# Patient Record
Sex: Female | Born: 1970
Health system: Southern US, Community
[De-identification: ages and names within clinical notes are randomized; demographics above are authoritative.]

## PROBLEM LIST (undated history)

## (undated) DIAGNOSIS — E079 Disorder of thyroid, unspecified: Secondary | ICD-10-CM

## (undated) DIAGNOSIS — I10 Essential (primary) hypertension: Secondary | ICD-10-CM

## (undated) HISTORY — PX: DILATION AND CURETTAGE OF UTERUS: SHX78

## (undated) HISTORY — DX: Disorder of thyroid, unspecified: E07.9

## (undated) HISTORY — DX: Essential (primary) hypertension: I10

## (undated) HISTORY — PX: KNEE SURGERY: SHX244

---

## 2001-06-16 ENCOUNTER — Other Ambulatory Visit: Admission: RE | Admit: 2001-06-16 | Discharge: 2001-06-16 | Payer: Self-pay | Admitting: Obstetrics and Gynecology

## 2002-02-18 ENCOUNTER — Inpatient Hospital Stay (HOSPITAL_COMMUNITY): Admission: AD | Admit: 2002-02-18 | Discharge: 2002-02-21 | Payer: Self-pay | Admitting: Obstetrics and Gynecology

## 2002-03-31 ENCOUNTER — Other Ambulatory Visit: Admission: RE | Admit: 2002-03-31 | Discharge: 2002-03-31 | Payer: Self-pay | Admitting: Obstetrics and Gynecology

## 2005-06-03 ENCOUNTER — Other Ambulatory Visit: Admission: RE | Admit: 2005-06-03 | Discharge: 2005-06-03 | Payer: Self-pay | Admitting: Obstetrics and Gynecology

## 2005-07-06 ENCOUNTER — Inpatient Hospital Stay (HOSPITAL_COMMUNITY): Admission: AD | Admit: 2005-07-06 | Discharge: 2005-07-06 | Payer: Self-pay | Admitting: Obstetrics and Gynecology

## 2007-04-04 ENCOUNTER — Inpatient Hospital Stay (HOSPITAL_COMMUNITY): Admission: AD | Admit: 2007-04-04 | Discharge: 2007-04-04 | Payer: Self-pay | Admitting: Obstetrics and Gynecology

## 2007-04-07 ENCOUNTER — Ambulatory Visit (HOSPITAL_COMMUNITY): Admission: RE | Admit: 2007-04-07 | Discharge: 2007-04-07 | Payer: Self-pay | Admitting: Obstetrics and Gynecology

## 2007-04-07 ENCOUNTER — Encounter (INDEPENDENT_AMBULATORY_CARE_PROVIDER_SITE_OTHER): Payer: Self-pay | Admitting: Obstetrics and Gynecology

## 2008-09-13 IMAGING — US US OB COMP LESS 14 WK
1 series · 14 of 25 positions shown · non-contrast
Comparison: none

OBSTETRICAL ULTRASOUND:

 This ultrasound exam was performed in the [HOSPITAL] Ultrasound Department.  The OB US report was generated in the AS system, and faxed to the ordering physician.  This report is also available in [REDACTED] PACS.

[Series 1: us ob comp less 14 wk · 0.28mm/px · 25 acquisitions, 14 frames shown]
[im 1/25]
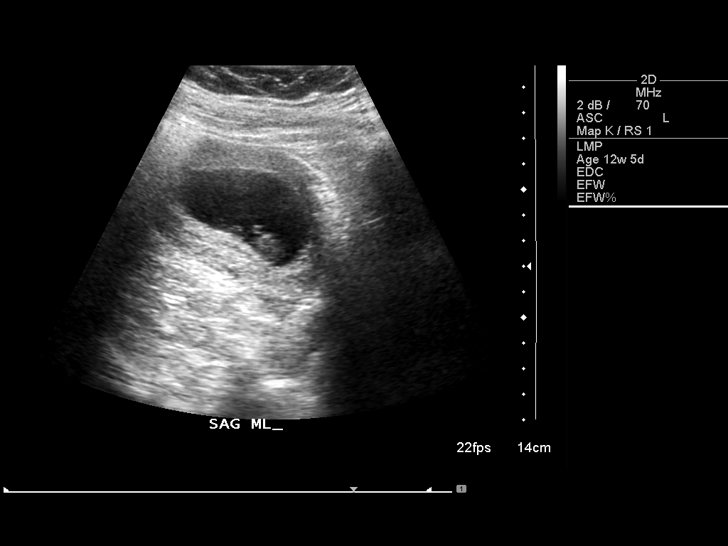
[im 3/25]
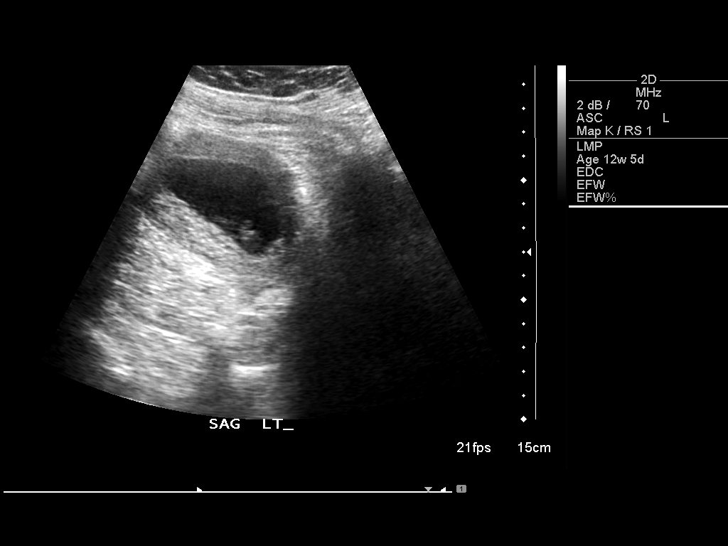
[im 5/25]
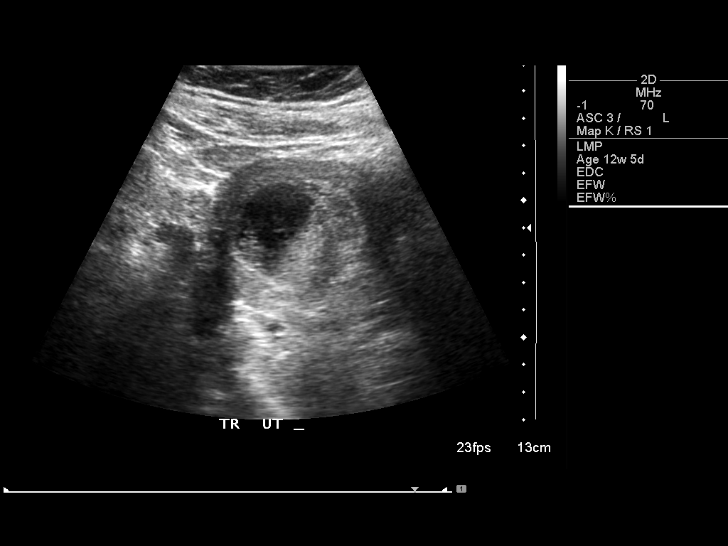
[im 7/25]
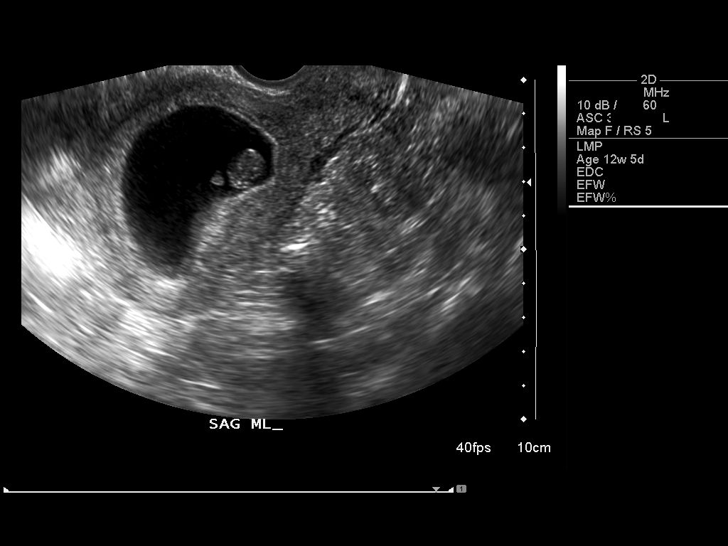
[im 9/25]
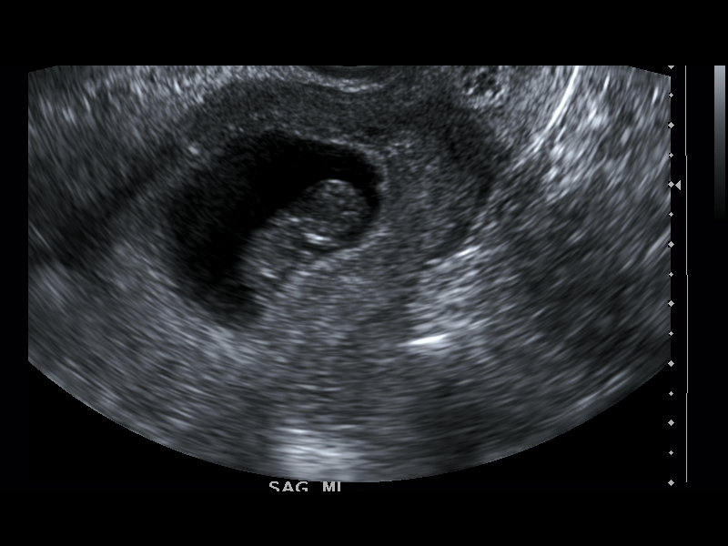
[im 10/25]
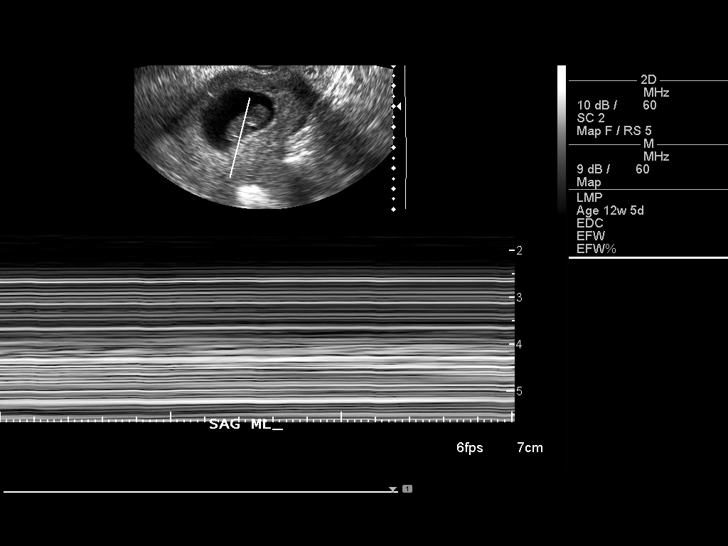
[im 12/25]
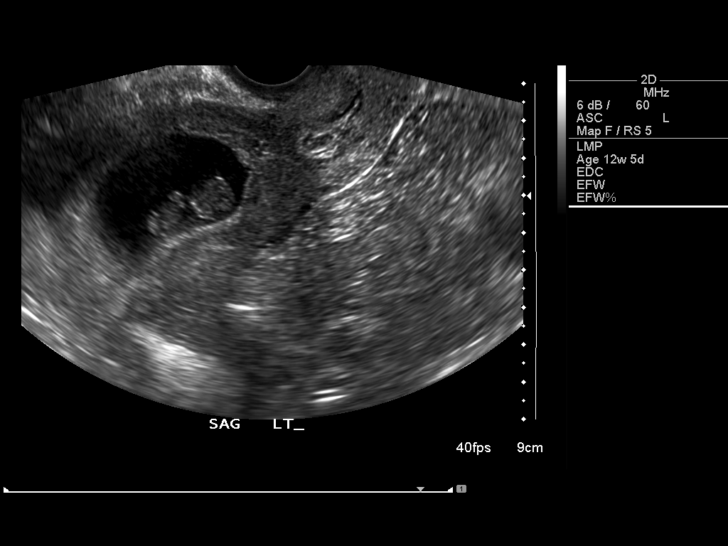
[im 14/25]
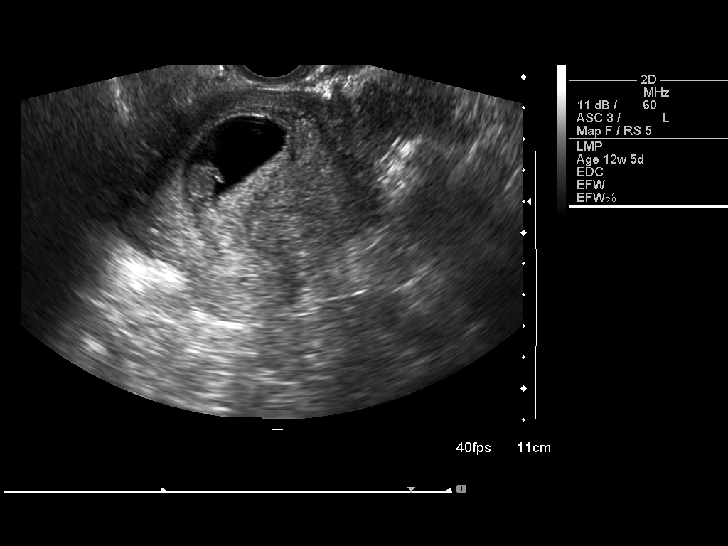
[im 16/25]
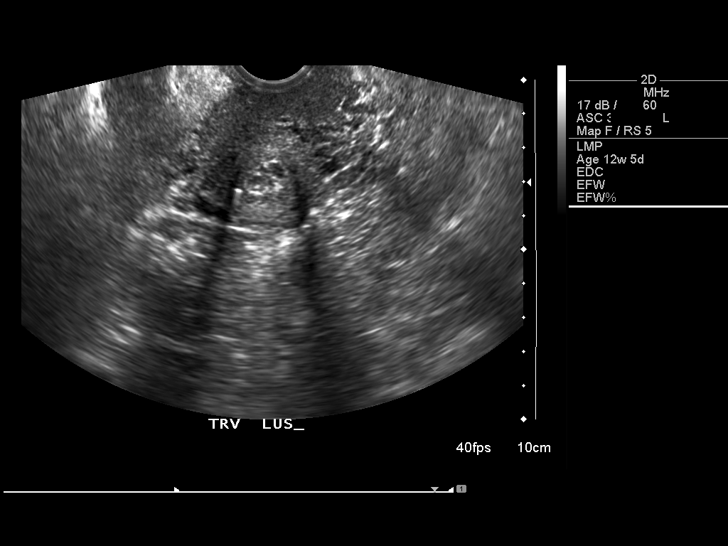
[im 17/25]
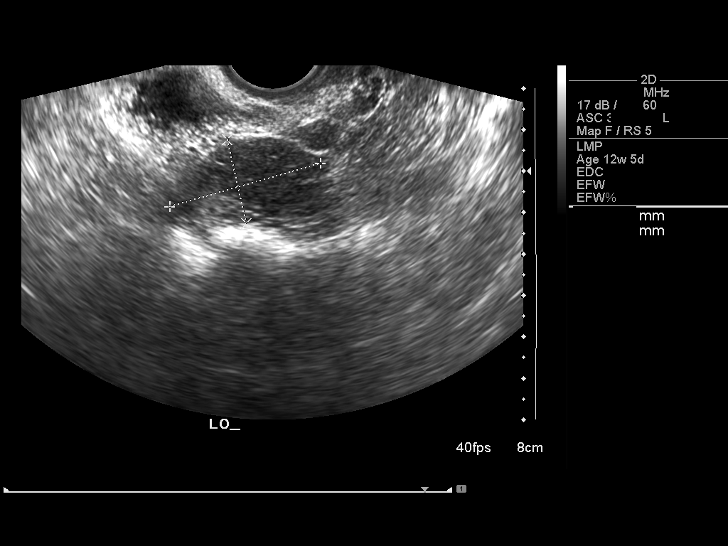
[im 19/25]
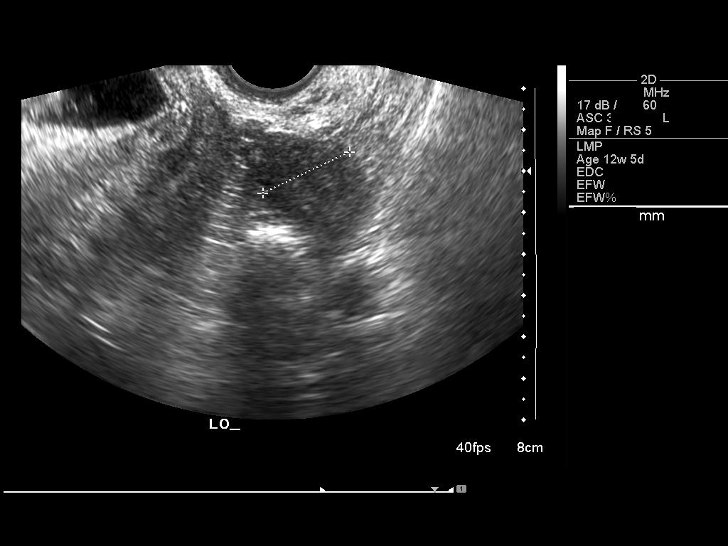
[im 21/25]
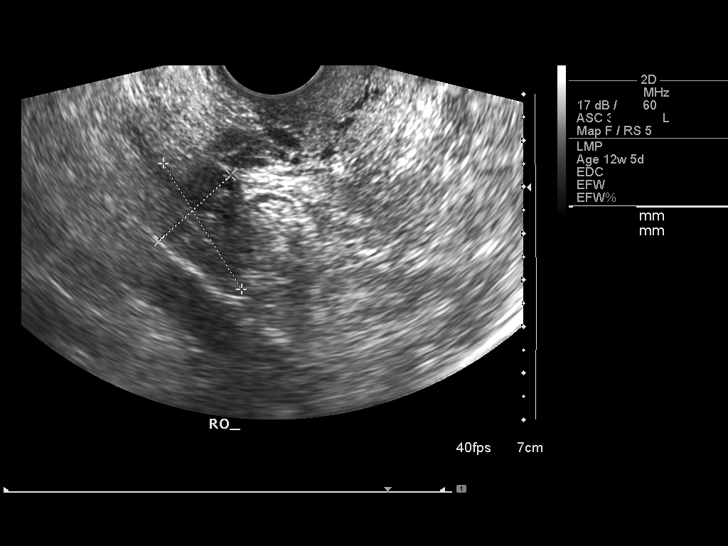
[im 23/25]
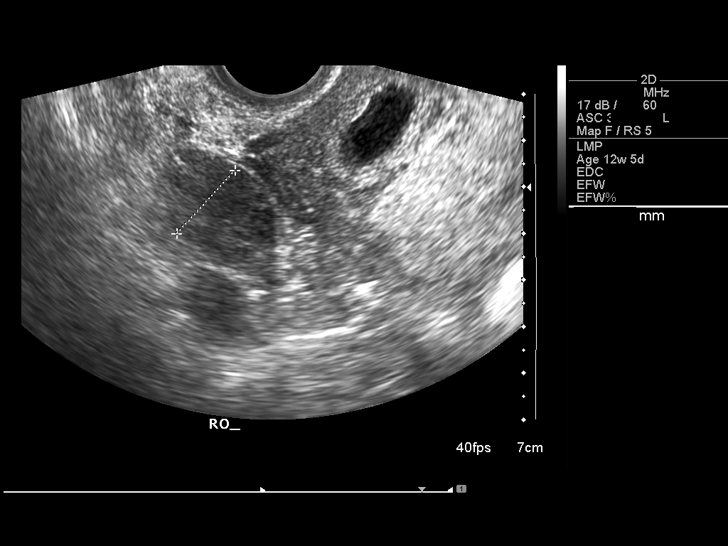
[im 25/25]
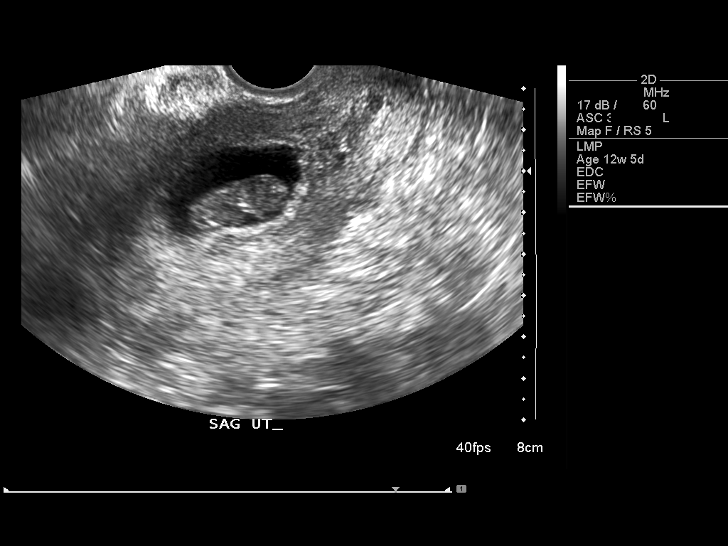

[14 of 25 positions shown; findings below may reference images not displayed]

IMPRESSION: See AS Obstetric US report.

## 2010-07-22 ENCOUNTER — Encounter: Admission: RE | Admit: 2010-07-22 | Discharge: 2010-07-22 | Payer: Self-pay | Admitting: Allergy

## 2010-11-24 ENCOUNTER — Encounter: Payer: Self-pay | Admitting: Internal Medicine

## 2011-03-18 NOTE — Op Note (Signed)
Amber Levy, Amber Levy                 ACCOUNT NO.:  [id number]   MEDICAL RECORD NO.:  [id number]          PATIENT TYPE:  AMB   LOCATION:  SDC                           FACILITY:  [facility]   PHYSICIAN:  Michelle L. Grewal, M.D.DATE OF BIRTH:  1970-11-15   DATE OF PROCEDURE:  04/07/2007  DATE OF DISCHARGE:                               OPERATIVE REPORT   PREOPERATIVE DIAGNOSIS:  Missed abortion. Blood type O positive.   POSTOPERATIVE DIAGNOSIS:  Missed abortion. Blood type O positive.   PROCEDURE:  Dilatation and evacuation.   SURGEON:  Dr. Vincente Poli.   ANESTHESIA:  MAC with local.   ESTIMATED BLOOD LOSS:  Minimal.   COMPLICATIONS:  None.   The patient was given informed consent. She was counseled and  acknowledged the known accepted risks of surgery which include a risk of  infection, risk of bleeding, risk of retained products and complications  associated with that, and risk of uterine perforation. She was taken to  the operating room after informed consent was obtained. She was prepped  and draped and given anesthesia. She was then placed in the lithotomy  position. She was then prepped and draped. After that, a speculum was  placed in the vagina. The cervix was noted to be slightly open, and this  is consistent with her report prior to the surgery that she had passed a  large amount of tissue early this morning. The cervix was grasped with a  tenaculum, and a paracervical block was performed. We did not need to  dilate her cervix because it was already dilated somewhat. I inserted a  #7 suction cannula and thoroughly suctioned the uterus out of probably a  minimal amount of tissue associated with products of conception. The  suction cannula was removed, a sharp curet was inserted, and a thorough  sharp curettage was performed x2. A final suction curettage was  performed. I was fairly confident that the uterine cavity was cleaned of  all tissue. At the end of the tissue, all  instruments were removed from  the vagina. All sponge, lap and instrument counts were correct x2. The  patient was offered chromosome analysis, but she and her husband  declined.   ESTIMATED BLOOD LOSS:  Minimal.   COMPLICATIONS:  None.      Michelle L. Vincente Poli, M.D.  Electronically Signed    MLG/MEDQ  D:  04/07/2007  T:  04/07/2007  Job:  [id number]

## 2011-08-21 LAB — CBC
MCHC: 33.6
MCV: 83.8
Platelets: 289
WBC: 9.4

## 2012-01-01 IMAGING — CT CT PARANASAL SINUSES LIMITED
1 series · 11 of 13 positions shown, 14 images · non-contrast
Comparison: None.

CLINICAL DATA: Frequent sinus infections, and drainage, cough

CT PARANASAL SINUS LIMITED WITHOUT CONTRAST
TECHNIQUE: Multidetector CT images of the paranasal sinuses were
obtained in a single plane without contrast.

[Series 2: cor bone · axial · 0.35mm/px · z∈[+8,+98]mm · 11 of 13 slices shown, 14 images]
[im 2/13  brain]
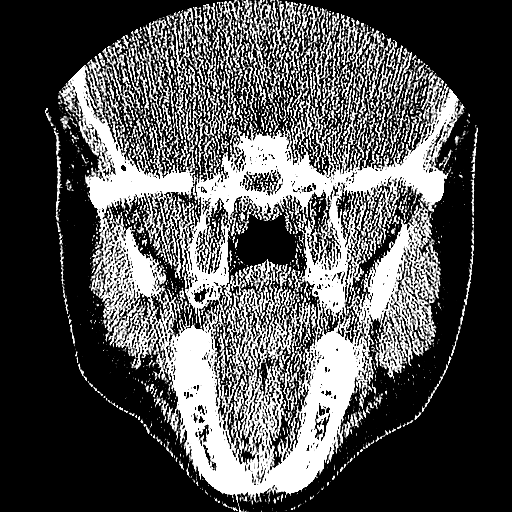
[im 2/13  bone]
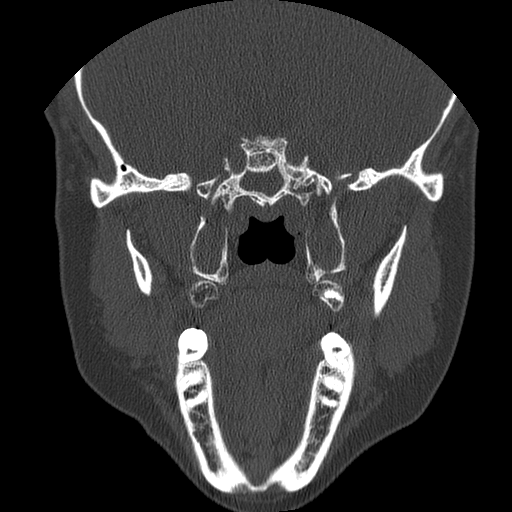
[im 3/13  bone]
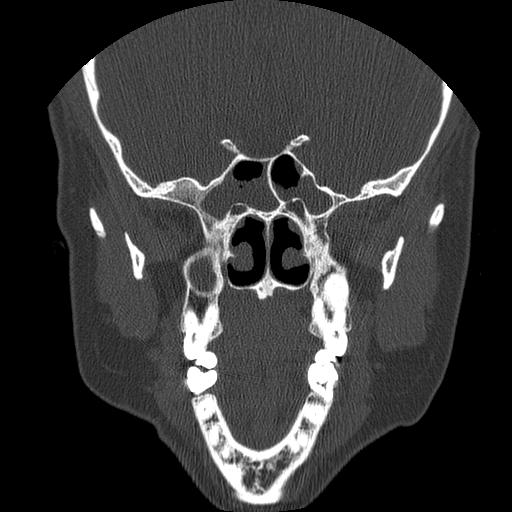
[im 4/13  bone]
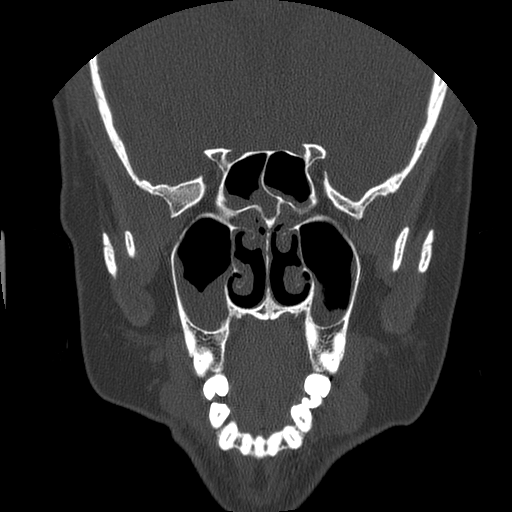
[im 5/13  bone]
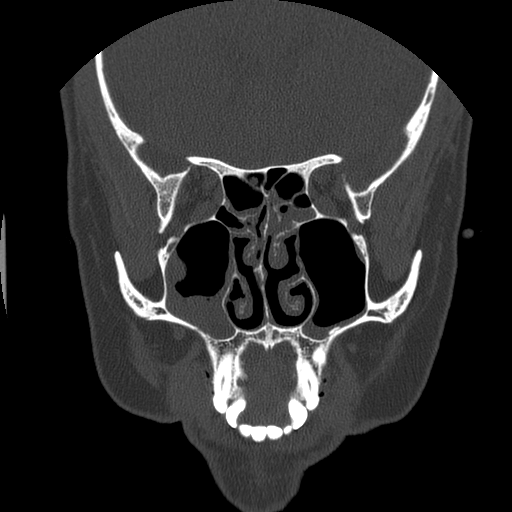
[im 6/13  brain]
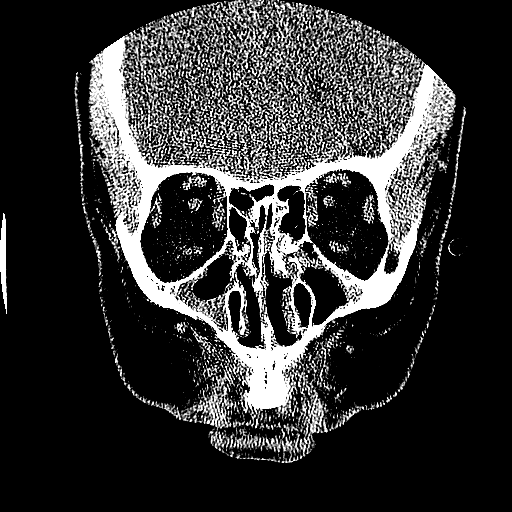
[im 6/13  bone]
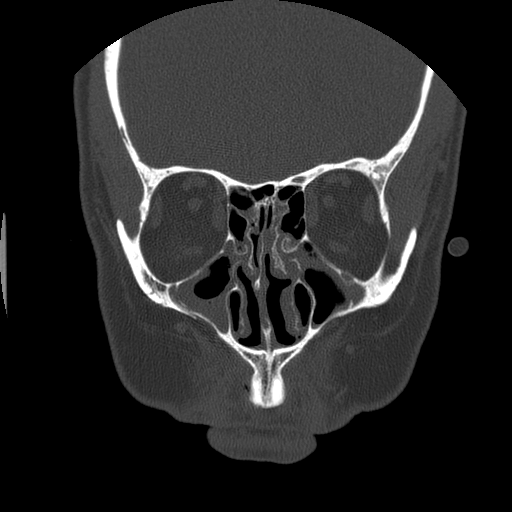
[im 7/13  bone]
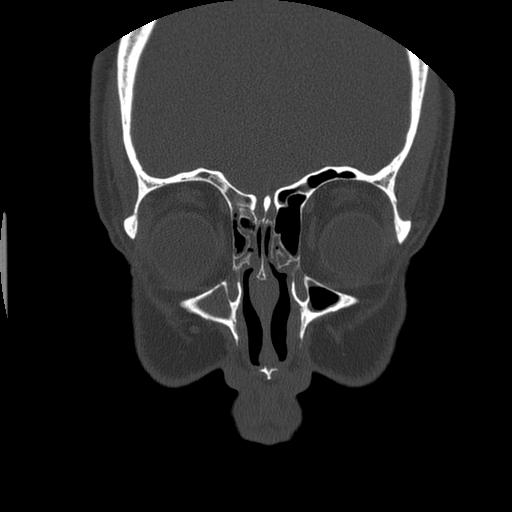
[im 8/13  bone]
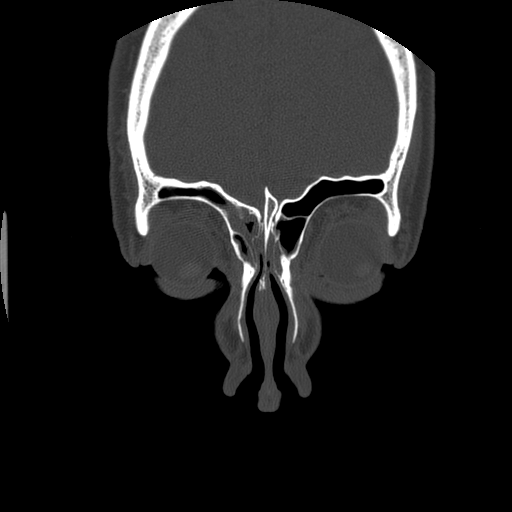
[im 9/13  bone]
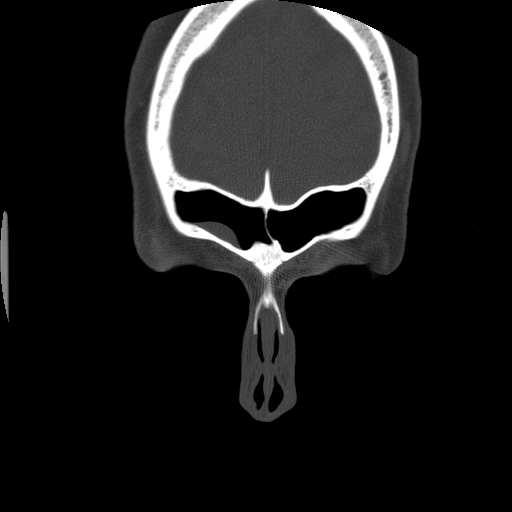
[im 10/13  brain]
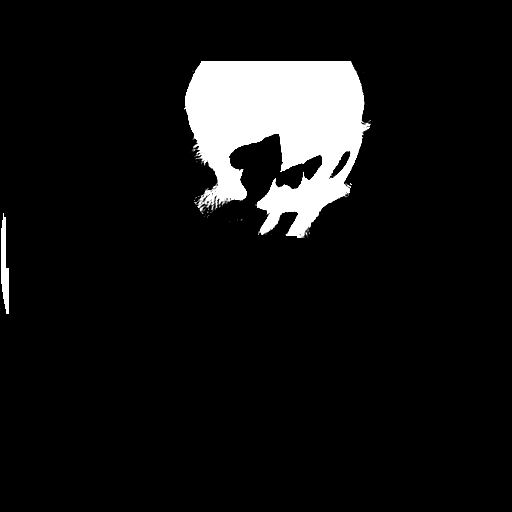
[im 10/13  bone]
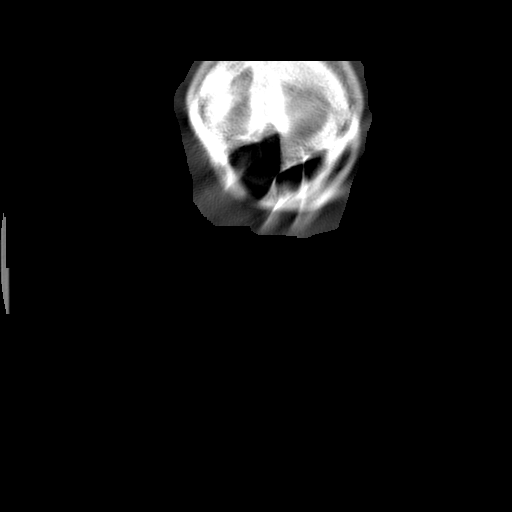
[im 11/13  bone]
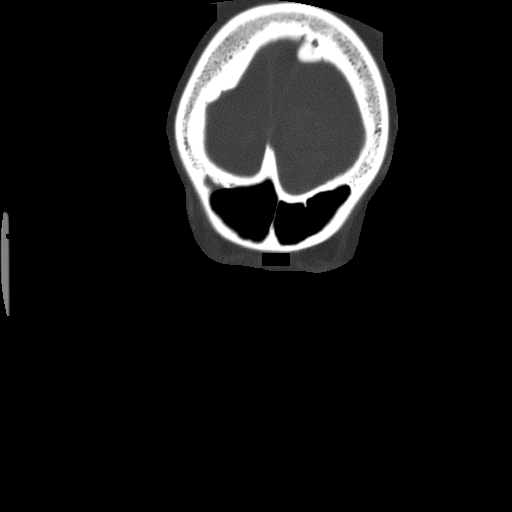
[im 12/13  bone]
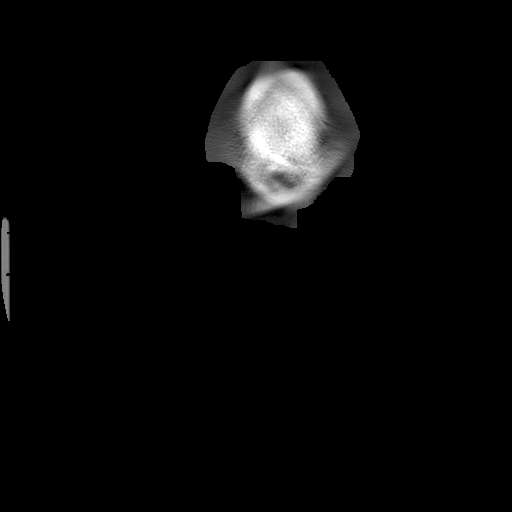

[11 of 13 positions shown; findings below may reference images not displayed]

FINDINGS: There is mucosal thickening in the maxillary sinuses,
ethmoid air cells, sphenoid sinus and frontal sinus consistent with
paranasal sinus disease diffusely.  There may be a small amount of
fluid within the left maxillary sinus.  The nasal turbinates are
normal in size and position and the nasal airway is patent.  No
bony abnormality is seen.
IMPRESSION: Diffuse paranasal sinus disease.

## 2012-03-08 ENCOUNTER — Other Ambulatory Visit: Payer: Self-pay | Admitting: Obstetrics and Gynecology

## 2013-09-27 ENCOUNTER — Other Ambulatory Visit: Payer: Self-pay | Admitting: Obstetrics and Gynecology

## 2015-04-04 ENCOUNTER — Other Ambulatory Visit: Payer: Self-pay | Admitting: Obstetrics and Gynecology

## 2015-04-06 LAB — CYTOLOGY - PAP

## 2016-04-28 ENCOUNTER — Ambulatory Visit: Payer: 59 | Admitting: Family Medicine

## 2016-11-23 DIAGNOSIS — Z23 Encounter for immunization: Secondary | ICD-10-CM | POA: Diagnosis not present

## 2017-02-23 DIAGNOSIS — J0100 Acute maxillary sinusitis, unspecified: Secondary | ICD-10-CM | POA: Diagnosis not present

## 2017-03-31 DIAGNOSIS — J3081 Allergic rhinitis due to animal (cat) (dog) hair and dander: Secondary | ICD-10-CM | POA: Diagnosis not present

## 2017-03-31 DIAGNOSIS — J4530 Mild persistent asthma, uncomplicated: Secondary | ICD-10-CM | POA: Diagnosis not present

## 2017-03-31 DIAGNOSIS — J3089 Other allergic rhinitis: Secondary | ICD-10-CM | POA: Diagnosis not present

## 2017-06-04 DIAGNOSIS — Z0000 Encounter for general adult medical examination without abnormal findings: Secondary | ICD-10-CM | POA: Diagnosis not present

## 2017-06-11 DIAGNOSIS — E039 Hypothyroidism, unspecified: Secondary | ICD-10-CM | POA: Diagnosis not present

## 2017-06-11 DIAGNOSIS — M25561 Pain in right knee: Secondary | ICD-10-CM | POA: Diagnosis not present

## 2017-06-11 DIAGNOSIS — Z0000 Encounter for general adult medical examination without abnormal findings: Secondary | ICD-10-CM | POA: Diagnosis not present

## 2017-06-24 DIAGNOSIS — E039 Hypothyroidism, unspecified: Secondary | ICD-10-CM | POA: Diagnosis not present

## 2017-06-24 DIAGNOSIS — M25561 Pain in right knee: Secondary | ICD-10-CM | POA: Diagnosis not present

## 2017-06-24 DIAGNOSIS — I10 Essential (primary) hypertension: Secondary | ICD-10-CM | POA: Diagnosis not present

## 2017-07-22 DIAGNOSIS — Z23 Encounter for immunization: Secondary | ICD-10-CM | POA: Diagnosis not present

## 2017-09-22 DIAGNOSIS — M25561 Pain in right knee: Secondary | ICD-10-CM | POA: Diagnosis not present

## 2017-09-22 DIAGNOSIS — S83242A Other tear of medial meniscus, current injury, left knee, initial encounter: Secondary | ICD-10-CM | POA: Diagnosis not present

## 2017-09-22 DIAGNOSIS — M7502 Adhesive capsulitis of left shoulder: Secondary | ICD-10-CM | POA: Diagnosis not present

## 2017-10-14 DIAGNOSIS — I10 Essential (primary) hypertension: Secondary | ICD-10-CM | POA: Diagnosis not present

## 2017-10-14 DIAGNOSIS — K210 Gastro-esophageal reflux disease with esophagitis: Secondary | ICD-10-CM | POA: Diagnosis not present

## 2017-10-14 DIAGNOSIS — E039 Hypothyroidism, unspecified: Secondary | ICD-10-CM | POA: Diagnosis not present

## 2017-10-15 DIAGNOSIS — M25561 Pain in right knee: Secondary | ICD-10-CM | POA: Diagnosis not present

## 2017-10-19 DIAGNOSIS — S83281A Other tear of lateral meniscus, current injury, right knee, initial encounter: Secondary | ICD-10-CM | POA: Diagnosis not present

## 2017-11-05 DIAGNOSIS — J040 Acute laryngitis: Secondary | ICD-10-CM | POA: Diagnosis not present

## 2017-11-05 DIAGNOSIS — J069 Acute upper respiratory infection, unspecified: Secondary | ICD-10-CM | POA: Diagnosis not present

## 2017-11-10 DIAGNOSIS — R05 Cough: Secondary | ICD-10-CM | POA: Diagnosis not present

## 2017-11-10 DIAGNOSIS — J018 Other acute sinusitis: Secondary | ICD-10-CM | POA: Diagnosis not present

## 2017-11-25 DIAGNOSIS — M6728 Synovial hypertrophy, not elsewhere classified, other site: Secondary | ICD-10-CM | POA: Diagnosis not present

## 2017-11-25 DIAGNOSIS — M23221 Derangement of posterior horn of medial meniscus due to old tear or injury, right knee: Secondary | ICD-10-CM | POA: Diagnosis not present

## 2017-11-25 DIAGNOSIS — M23241 Derangement of anterior horn of lateral meniscus due to old tear or injury, right knee: Secondary | ICD-10-CM | POA: Diagnosis not present

## 2017-11-25 DIAGNOSIS — G8918 Other acute postprocedural pain: Secondary | ICD-10-CM | POA: Diagnosis not present

## 2017-12-07 DIAGNOSIS — M25561 Pain in right knee: Secondary | ICD-10-CM | POA: Diagnosis not present

## 2017-12-07 DIAGNOSIS — Z4789 Encounter for other orthopedic aftercare: Secondary | ICD-10-CM | POA: Diagnosis not present

## 2017-12-08 DIAGNOSIS — Z6832 Body mass index (BMI) 32.0-32.9, adult: Secondary | ICD-10-CM | POA: Diagnosis not present

## 2017-12-08 DIAGNOSIS — Z1212 Encounter for screening for malignant neoplasm of rectum: Secondary | ICD-10-CM | POA: Diagnosis not present

## 2017-12-08 DIAGNOSIS — Z01419 Encounter for gynecological examination (general) (routine) without abnormal findings: Secondary | ICD-10-CM | POA: Diagnosis not present

## 2017-12-21 DIAGNOSIS — M25561 Pain in right knee: Secondary | ICD-10-CM | POA: Diagnosis not present

## 2017-12-21 DIAGNOSIS — Z4789 Encounter for other orthopedic aftercare: Secondary | ICD-10-CM | POA: Diagnosis not present

## 2018-03-30 DIAGNOSIS — J4530 Mild persistent asthma, uncomplicated: Secondary | ICD-10-CM | POA: Diagnosis not present

## 2018-03-30 DIAGNOSIS — J3081 Allergic rhinitis due to animal (cat) (dog) hair and dander: Secondary | ICD-10-CM | POA: Diagnosis not present

## 2018-03-30 DIAGNOSIS — J3089 Other allergic rhinitis: Secondary | ICD-10-CM | POA: Diagnosis not present

## 2018-05-18 DIAGNOSIS — N390 Urinary tract infection, site not specified: Secondary | ICD-10-CM | POA: Diagnosis not present

## 2018-05-18 DIAGNOSIS — R300 Dysuria: Secondary | ICD-10-CM | POA: Diagnosis not present

## 2018-06-29 DIAGNOSIS — N390 Urinary tract infection, site not specified: Secondary | ICD-10-CM | POA: Diagnosis not present

## 2018-07-29 DIAGNOSIS — Z23 Encounter for immunization: Secondary | ICD-10-CM | POA: Diagnosis not present

## 2018-08-16 DIAGNOSIS — I10 Essential (primary) hypertension: Secondary | ICD-10-CM | POA: Diagnosis not present

## 2018-08-16 DIAGNOSIS — K760 Fatty (change of) liver, not elsewhere classified: Secondary | ICD-10-CM | POA: Diagnosis not present

## 2018-08-16 DIAGNOSIS — E039 Hypothyroidism, unspecified: Secondary | ICD-10-CM | POA: Diagnosis not present

## 2018-08-16 DIAGNOSIS — Z0000 Encounter for general adult medical examination without abnormal findings: Secondary | ICD-10-CM | POA: Diagnosis not present

## 2018-08-23 DIAGNOSIS — M150 Primary generalized (osteo)arthritis: Secondary | ICD-10-CM | POA: Diagnosis not present

## 2018-08-23 DIAGNOSIS — Z0000 Encounter for general adult medical examination without abnormal findings: Secondary | ICD-10-CM | POA: Diagnosis not present

## 2018-08-23 DIAGNOSIS — K760 Fatty (change of) liver, not elsewhere classified: Secondary | ICD-10-CM | POA: Diagnosis not present

## 2018-08-23 DIAGNOSIS — I10 Essential (primary) hypertension: Secondary | ICD-10-CM | POA: Diagnosis not present

## 2018-08-23 DIAGNOSIS — R1011 Right upper quadrant pain: Secondary | ICD-10-CM | POA: Diagnosis not present

## 2018-08-25 ENCOUNTER — Other Ambulatory Visit: Payer: Self-pay | Admitting: Internal Medicine

## 2018-08-25 DIAGNOSIS — R1011 Right upper quadrant pain: Secondary | ICD-10-CM

## 2018-08-25 DIAGNOSIS — K760 Fatty (change of) liver, not elsewhere classified: Secondary | ICD-10-CM

## 2018-08-31 ENCOUNTER — Ambulatory Visit
Admission: RE | Admit: 2018-08-31 | Discharge: 2018-08-31 | Disposition: A | Discharge status: Home / Self Care | Payer: 59 | Source: Ambulatory Visit | Attending: Internal Medicine | Admitting: Internal Medicine

## 2018-08-31 DIAGNOSIS — K760 Fatty (change of) liver, not elsewhere classified: Secondary | ICD-10-CM | POA: Diagnosis not present

## 2018-08-31 DIAGNOSIS — R1011 Right upper quadrant pain: Secondary | ICD-10-CM

## 2018-09-09 DIAGNOSIS — L4050 Arthropathic psoriasis, unspecified: Secondary | ICD-10-CM | POA: Diagnosis not present

## 2018-09-09 DIAGNOSIS — M25512 Pain in left shoulder: Secondary | ICD-10-CM | POA: Diagnosis not present

## 2018-09-09 DIAGNOSIS — M1990 Unspecified osteoarthritis, unspecified site: Secondary | ICD-10-CM | POA: Diagnosis not present

## 2018-09-09 DIAGNOSIS — M25562 Pain in left knee: Secondary | ICD-10-CM | POA: Diagnosis not present

## 2018-09-09 DIAGNOSIS — L409 Psoriasis, unspecified: Secondary | ICD-10-CM | POA: Diagnosis not present

## 2018-09-09 DIAGNOSIS — M1711 Unilateral primary osteoarthritis, right knee: Secondary | ICD-10-CM | POA: Diagnosis not present

## 2018-09-09 DIAGNOSIS — M79641 Pain in right hand: Secondary | ICD-10-CM | POA: Diagnosis not present

## 2018-09-09 DIAGNOSIS — M19011 Primary osteoarthritis, right shoulder: Secondary | ICD-10-CM | POA: Diagnosis not present

## 2018-09-09 DIAGNOSIS — M79642 Pain in left hand: Secondary | ICD-10-CM | POA: Diagnosis not present

## 2018-11-15 DIAGNOSIS — M25561 Pain in right knee: Secondary | ICD-10-CM | POA: Diagnosis not present

## 2018-11-15 DIAGNOSIS — M1711 Unilateral primary osteoarthritis, right knee: Secondary | ICD-10-CM | POA: Diagnosis not present

## 2019-09-11 IMAGING — US US ABDOMEN LIMITED
1 series · 14 of 25 positions shown · non-contrast
Comparison: Report of an ultrasound March 29, 2011.

CLINICAL DATA: Right upper quadrant pain. Known fatty liver
disease.

EXAM:
ULTRASOUND ABDOMEN LIMITED RIGHT UPPER QUADRANT

[Series 1: us abdomen limited · 0.25mm/px · 14 of 38 slices shown]
[im 1/38]
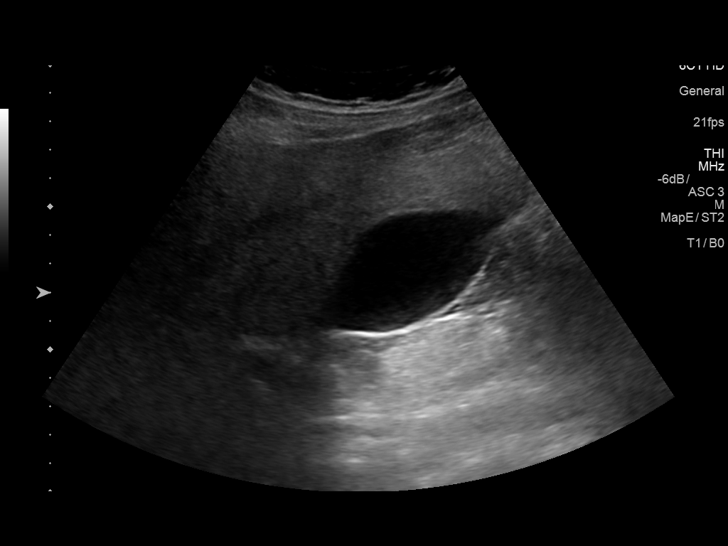
[im 4/38]
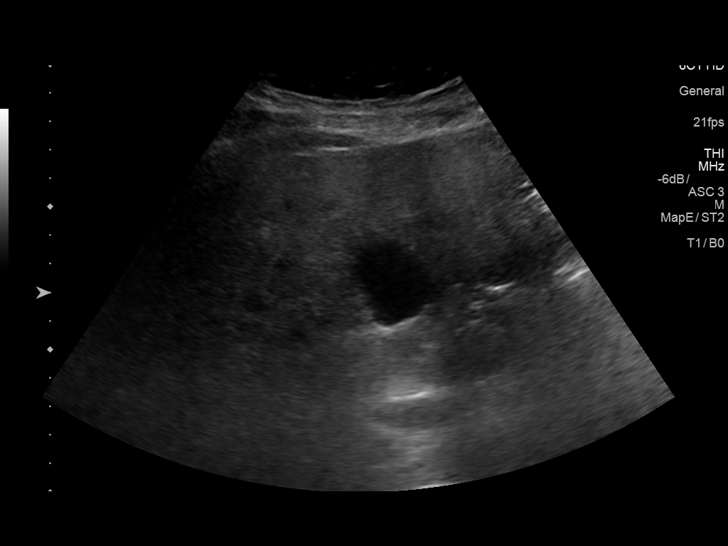
[im 7/38]
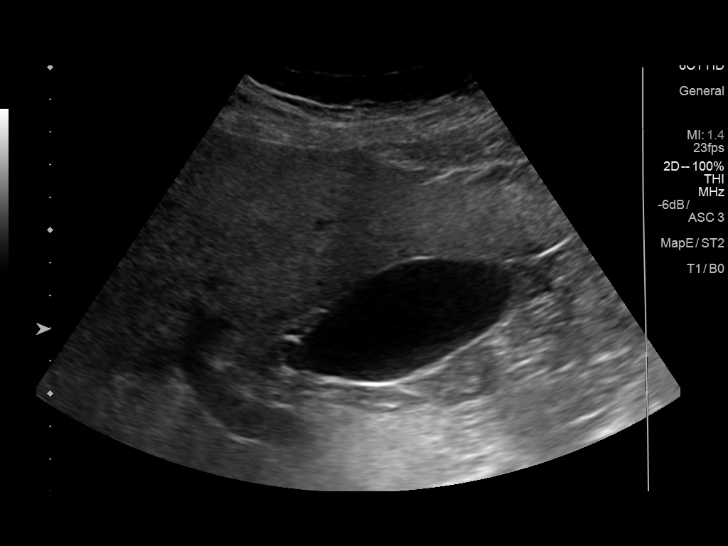
[im 10/38]
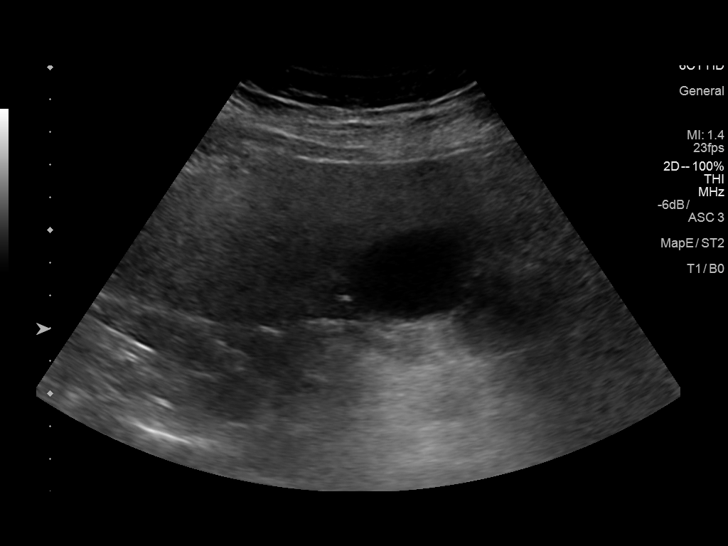
[im 13/38]
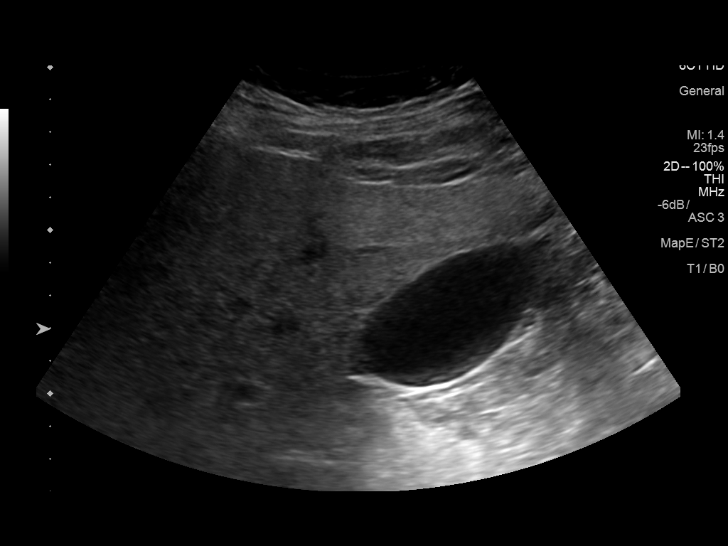
[im 14/38]
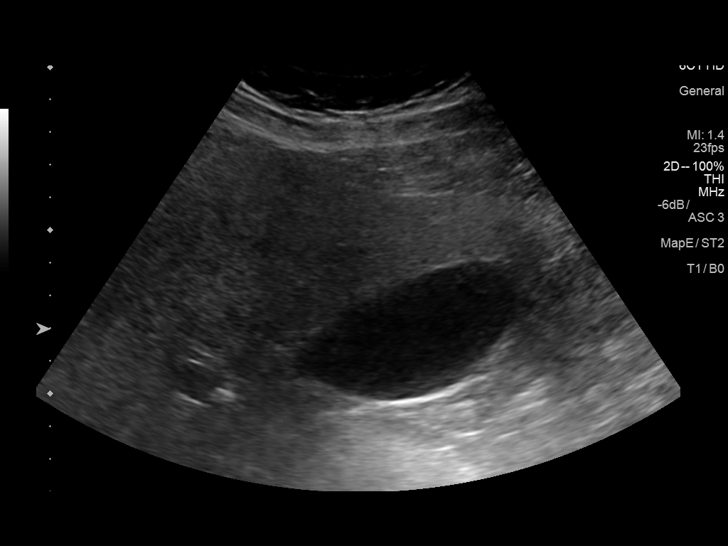
[im 17/38]
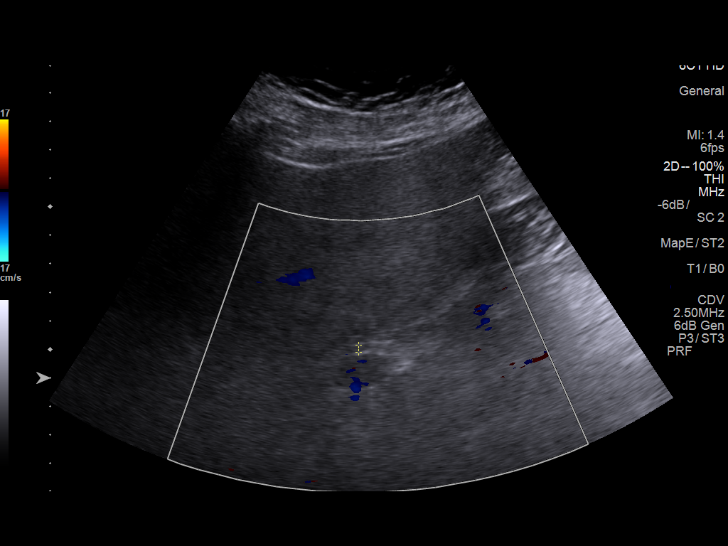
[im 21/38]
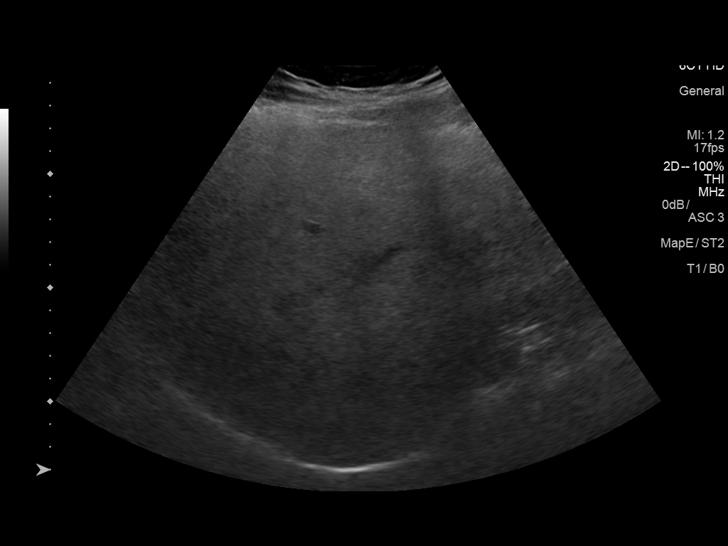
[im 24/38]
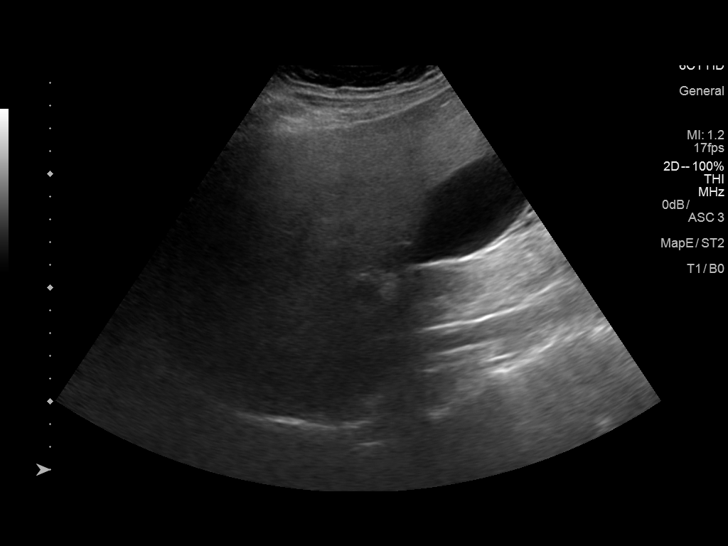
[im 25/38]
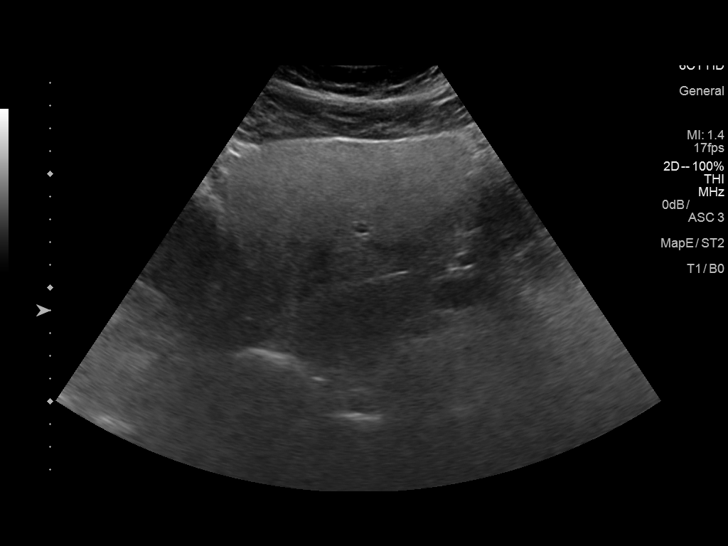
[im 28/38]
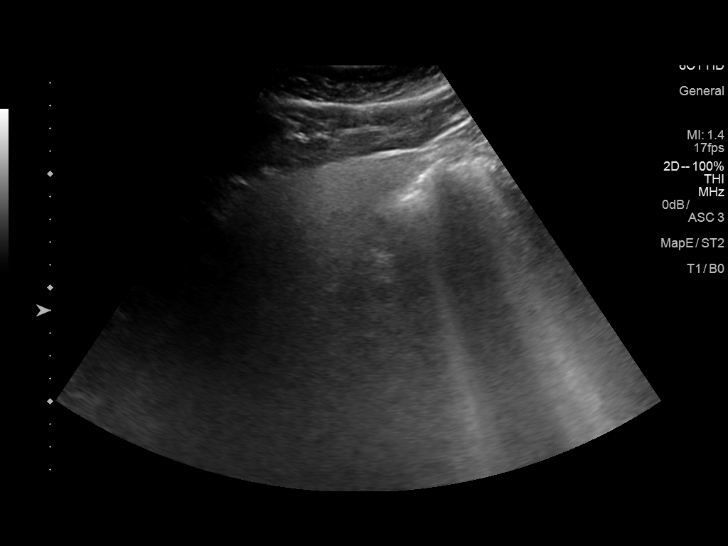
[im 31/38]
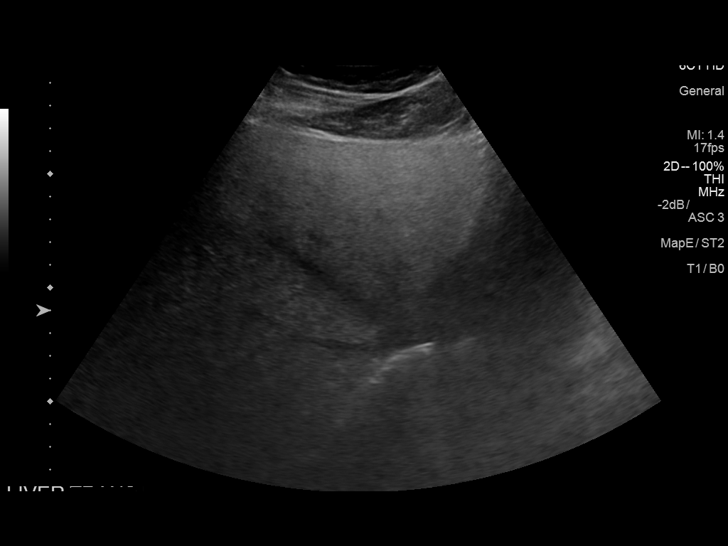
[im 34/38]
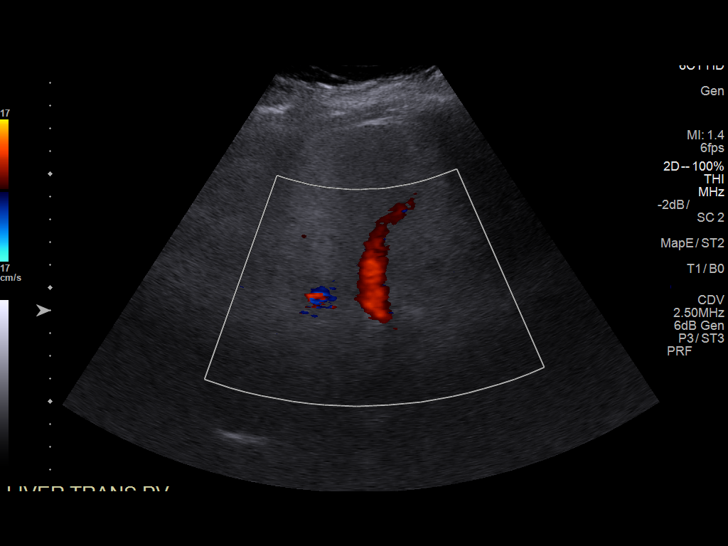
[im 38/38]
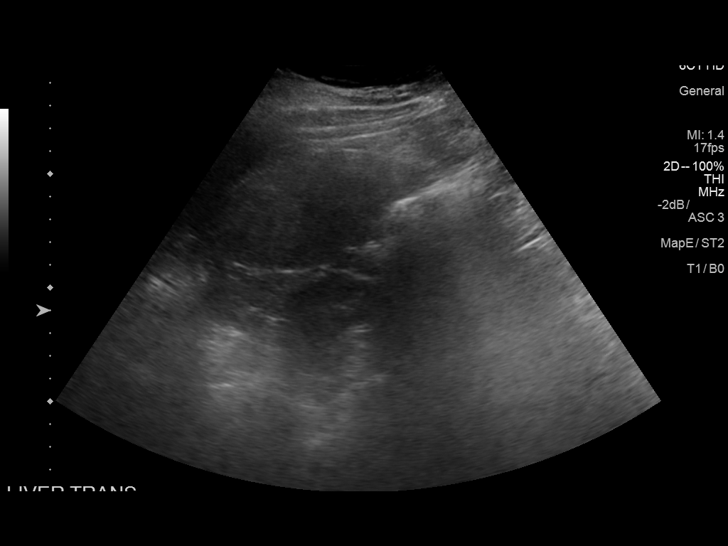

[14 of 25 positions shown; findings below may reference images not displayed]

FINDINGS: Gallbladder:

The gallbladder is adequately distended. No stones or sludge are
observed. There is no gallbladder wall thickening, pericholecystic
fluid, or positive sonographic Murphy's sign.

Common bile duct:

Diameter: 2.5 mm

Liver:

The hepatic echotexture is increased diffusely and is difficult to
penetrate with the ultrasound beam. The surface contour is smooth.
There is no discrete mass or ductal dilation. Portal vein is patent
on color Doppler imaging with normal direction of blood flow towards
the liver.
IMPRESSION: Normal appearance of the gallbladder.  No stones or sludge observed.

Increased hepatic echotexture compatible with fatty infiltrative
change. Penetration of the organ by the ultrasound beam is limited
which decreases the sensitivity of the study.

## 2021-05-23 ENCOUNTER — Encounter: Payer: Self-pay | Admitting: Gastroenterology

## 2021-06-11 ENCOUNTER — Other Ambulatory Visit: Payer: Self-pay

## 2021-06-11 ENCOUNTER — Ambulatory Visit (AMBULATORY_SURGERY_CENTER): Payer: BC Managed Care – PPO

## 2021-06-11 VITALS — Ht 67.0 in | Wt 195.0 lb

## 2021-06-11 DIAGNOSIS — Z1211 Encounter for screening for malignant neoplasm of colon: Secondary | ICD-10-CM

## 2021-06-11 NOTE — Progress Notes (Signed)
Patient's pre-visit was done today over the phone with the patient  Name,DOB and address verified. Patient denies any allergies to Eggs and Soy. Patient denies any problems with anesthesia/sedation. Patient denies taking diet pills or blood thinners. No home Oxygen. Packet of Prep instructions mailed to patient including a copy of a consent form-pt is aware. Patient understands to call us back with any questions or concerns. Patient is aware of our care-partner policy and Covid-19 safety protocol.       

## 2021-06-28 ENCOUNTER — Encounter: Payer: Self-pay | Admitting: Gastroenterology

## 2021-06-28 ENCOUNTER — Ambulatory Visit (AMBULATORY_SURGERY_CENTER): Payer: BC Managed Care – PPO | Admitting: Gastroenterology

## 2021-06-28 ENCOUNTER — Other Ambulatory Visit: Payer: Self-pay

## 2021-06-28 VITALS — BP 124/65 | HR 69 | Temp 98.0°F | Resp 14 | Ht 67.0 in | Wt 195.0 lb

## 2021-06-28 DIAGNOSIS — Z1211 Encounter for screening for malignant neoplasm of colon: Secondary | ICD-10-CM

## 2021-06-28 MED ORDER — SODIUM CHLORIDE 0.9 % IV SOLN: 500.0000 mL | Freq: Once | INTRAVENOUS | Status: DC

## 2021-06-28 NOTE — Progress Notes (Signed)
Report to PACU, RN, vss, BBS= Clear.  

## 2021-06-28 NOTE — Patient Instructions (Signed)
YOU HAD AN ENDOSCOPIC PROCEDURE TODAY AT THE [facility]:   Refer to the procedure report that was given to you for any specific questions about what was found during the examination.  If the procedure report does not answer your questions, please call your gastroenterologist to clarify.  If you requested that your care partner not be given the details of your procedure findings, then the procedure report has been included in a sealed envelope for you to review at your convenience later.  YOU SHOULD EXPECT: Some feelings of bloating in the abdomen. Passage of more gas than usual.  Walking can help get rid of the air that was put into your GI tract during the procedure and reduce the bloating. If you had a lower endoscopy (such as a colonoscopy or flexible sigmoidoscopy) you may notice spotting of blood in your stool or on the toilet paper. If you underwent a bowel prep for your procedure, you may not have a normal bowel movement for a few days.  Please Note:  You might notice some irritation and congestion in your nose or some drainage.  This is from the oxygen used during your procedure.  There is no need for concern and it should clear up in a day or so.  SYMPTOMS TO REPORT IMMEDIATELY:   Following lower endoscopy (colonoscopy or flexible sigmoidoscopy):  Excessive amounts of blood in the stool  Significant tenderness or worsening of abdominal pains  Swelling of the abdomen that is new, acute  Fever of 100F or higher  For urgent or emergent issues, a gastroenterologist can be reached at any hour by calling [phone]. Do not use MyChart messaging for urgent concerns.    DIET:  We do recommend a small meal at first, but then you may proceed to your regular diet.  Drink plenty of fluids but you should avoid alcoholic beverages for 24 hours.  ACTIVITY:  You should plan to take it easy for the rest of today and you should NOT DRIVE or use heavy machinery until tomorrow (because  of the sedation medicines used during the test).    FOLLOW UP: Our staff will call the number listed on your records 48-72 hours following your procedure to check on you and address any questions or concerns that you may have regarding the information given to you following your procedure. If we do not reach you, we will leave a message.  We will attempt to reach you two times.  During this call, we will ask if you have developed any symptoms of COVID 19. If you develop any symptoms (ie: fever, flu-like symptoms, shortness of breath, cough etc.) before then, please call [phone].  If you test positive for Covid 19 in the 2 weeks post procedure, please call and report this information to us.    If any biopsies were taken you will be contacted by phone or by letter within the next 1-3 weeks.  Please call us at [phone] if you have not heard about the biopsies in 3 weeks.    SIGNATURES/CONFIDENTIALITY: You and/or your care partner have signed paperwork which will be entered into your electronic medical record.  These signatures attest to the fact that that the information above on your After Visit Summary has been reviewed and is understood.  Full responsibility of the confidentiality of this discharge information lies with you and/or your care-partner. 

## 2021-06-28 NOTE — Progress Notes (Signed)
VS completed by CW.   Pt's states no medical or surgical changes since previsit or office visit.  

## 2021-06-28 NOTE — Op Note (Signed)
[facility] Patient Name: Amber Levy Procedure Date: 06/28/2021 7:53 AM MRN: [id number] Endoscopist: Rachael Fee , MD Age: 50 Referring MD:  Date of Birth: 06/08/1971 Gender: Female Account #: [id number] Procedure:                Colonoscopy Indications:              Screening for colorectal malignant neoplasm Medicines:                Monitored Anesthesia Care Procedure:                Pre-Anesthesia Assessment:                           - Prior to the procedure, a History and Physical                            was performed, and patient medications and                            allergies were reviewed. The patient's tolerance of                            previous anesthesia was also reviewed. The risks                            and benefits of the procedure and the sedation                            options and risks were discussed with the patient.                            All questions were answered, and informed consent                            was obtained. Prior Anticoagulants: The patient has                            taken no previous anticoagulant or antiplatelet                            agents. ASA Grade Assessment: II - A patient with                            mild systemic disease. After reviewing the risks                            and benefits, the patient was deemed in                            satisfactory condition to undergo the procedure.                           After obtaining informed consent, the colonoscope  was passed under direct vision. Throughout the                            procedure, the patient's blood pressure, pulse, and                            oxygen saturations were monitored continuously. The                            Olympus CF-[id number] was introduced through                            the anus and advanced to the the cecum, identified                            by appendiceal  orifice and ileocecal valve. The                            colonoscopy was performed without difficulty. The                            patient tolerated the procedure well. The quality                            of the bowel preparation was good. The ileocecal                            valve, appendiceal orifice, and rectum were                            photographed. Scope In: 7:59:19 AM Scope Out: 8:12:28 AM Scope Withdrawal Time: 0 hours 9 minutes 44 seconds  Total Procedure Duration: 0 hours 13 minutes 9 seconds  Findings:                 Multiple small and large-mouthed diverticula were                            found in the left colon.                           The exam was otherwise without abnormality on                            direct and retroflexion views. Complications:            No immediate complications. Estimated blood loss:                            None. Estimated Blood Loss:     Estimated blood loss: none. Impression:               - Diverticulosis in the left colon.                           - The examination was otherwise normal on direct  and retroflexion views.                           - No polyps or cancers. Recommendation:           - Patient has a contact number available for                            emergencies. The signs and symptoms of potential                            delayed complications were discussed with the                            patient. Return to normal activities tomorrow.                            Written discharge instructions were provided to the                            patient.                           - Resume previous diet.                           - Continue present medications.                           - Repeat colonoscopy in 10 years for screening.                           - Trial of OTC fiber supplement citrucel for your                            mild constipation. Rachael Fee,  MD 06/28/2021 8:14:58 AM This report has been signed electronically.

## 2021-06-28 NOTE — Progress Notes (Signed)
HPI: This is a woman at routine risk for colon cancer   ROS: complete GI ROS as described in HPI, all other review negative.  Constitutional:  No unintentional weight loss   Past Medical History:  Diagnosis Date   Hypertension    Thyroid disease     Past Surgical History:  Procedure Laterality Date   DILATION AND CURETTAGE OF UTERUS     after miscarriage   KNEE SURGERY Right     Current Outpatient Medications  Medication Sig Dispense Refill   buPROPion (WELLBUTRIN XL) 150 MG 24 hr tablet 1 tablet in the morning     hydrochlorothiazide (HYDRODIURIL) 25 MG tablet Take 1 tablet by mouth daily.     levothyroxine (SYNTHROID) 200 MCG tablet Take 200 mcg by mouth daily.     meloxicam (MOBIC) 15 MG tablet Take 15 mg by mouth daily.     Current Facility-Administered Medications  Medication Dose Route Frequency Provider Last Rate Last Admin   0.9 %  sodium chloride infusion  500 mL Intravenous Once Rachael Fee, MD        Allergies as of 06/28/2021   (No Known Allergies)    Family History  Problem Relation Age of Onset   Colon cancer Neg Hx    Colon polyps Neg Hx    Esophageal cancer Neg Hx    Rectal cancer Neg Hx    Stomach cancer Neg Hx     Social History   Socioeconomic History   Marital status: Single    Spouse name: Not on file   Number of children: Not on file   Years of education: Not on file   Highest education level: Not on file  Occupational History   Not on file  Tobacco Use   Smoking status: Never   Smokeless tobacco: Never  Vaping Use   Vaping Use: Never used  Substance and Sexual Activity   Alcohol use: Yes    Comment: rarely   Drug use: Never   Sexual activity: Not on file  Other Topics Concern   Not on file  Social History Narrative   Not on file   Social Determinants of Health   Financial Resource Strain: Not on file  Food Insecurity: Not on file  Transportation Needs: Not on file  Physical Activity: Not on file  Stress: Not on  file  Social Connections: Not on file  Intimate Partner Violence: Not on file     Physical Exam: BP 134/75   Pulse 77   Temp 98 F (36.7 C)   Ht 5\' 7"  (1.702 m)   Wt 195 lb (88.5 kg)   LMP  (LMP Unknown)   SpO2 96%   BMI 30.54 kg/m  Constitutional: generally well-appearing Psychiatric: alert and oriented x3 Lungs: CTA bilaterally Heart: no MCR  Assessment and plan: 50 y.o. female with routine risk for colon cancer  Screening colonoscopy today  Care is appropriate for the ambulatory setting.  42, MD [facility] 06/28/2021, 7:43 AM

## 2021-07-02 ENCOUNTER — Telehealth: Payer: Self-pay | Admitting: *Deleted

## 2021-07-02 NOTE — Telephone Encounter (Signed)
  Follow up Call-  Call back number 06/28/2021  Post procedure Call Back phone  # [phone]  Permission to leave phone message Yes  Some recent data might be hidden     Patient questions:  Do you have a fever, pain , or abdominal swelling? No. Pain Score  0 *  Have you tolerated food without any problems? Yes.    Have you been able to return to your normal activities? Yes.    Do you have any questions about your discharge instructions: Diet   No. Medications  No. Follow up visit  No.  Do you have questions or concerns about your Care? No.  Actions: * If pain score is 4 or above: No action needed, pain <4.Have you developed a fever since your procedure? no  2.   Have you had an respiratory symptoms (SOB or cough) since your procedure? no  3.   Have you tested positive for COVID 19 since your procedure no  4.   Have you had any family members/close contacts diagnosed with the COVID 19 since your procedure?  no   If yes to any of these questions please route to Laverna Peace, RN and Karlton Lemon, RN

## 2022-03-17 ENCOUNTER — Ambulatory Visit (INDEPENDENT_AMBULATORY_CARE_PROVIDER_SITE_OTHER): Payer: BC Managed Care – PPO | Admitting: Pulmonary Disease

## 2022-03-17 ENCOUNTER — Encounter: Payer: Self-pay | Admitting: Pulmonary Disease

## 2022-03-17 VITALS — BP 114/78 | HR 92 | Ht 67.0 in | Wt 205.0 lb

## 2022-03-17 DIAGNOSIS — R400 Somnolence: Secondary | ICD-10-CM

## 2022-03-17 NOTE — Progress Notes (Signed)
? ?      ?GRISEL TRAVASSOS    [id number]    July 17, 1971 ? ?Primary Care Physician:Ramachandran, Mauro Kaufmann, MD ? ?Referring Physician: Merrilee Seashore, MD ?[place],  [place] [address] ? ?Chief complaint:   ?Snoring, witnessed apnea, nonrestorative sleep ? ?HPI: ? ?Longstanding history of snoring ?Spouse at told her about apneas ? ?Worse symptoms when on her back ? ?Usually goes to bed between 9 and 10 PM ?Takes about 15 minutes to fall asleep ?4-5 awakenings ?Final wake up time about 6 AM ? ?Weight has been stable, has fluctuated by 5 pounds up and down ? ?Does have a history of hypothyroidism, hypertension, allergy and sinus problems ? ?Dad snored ? ?No pets ? ?No pertinent occupational history ? ?Outpatient Encounter Medications as of 03/17/2022  ?Medication Sig  ? albuterol (VENTOLIN HFA) 108 (90 Base) MCG/ACT inhaler 1 puff as needed  ? buPROPion (WELLBUTRIN XL) 150 MG 24 hr tablet 1 tablet in the morning  ? hydrochlorothiazide (HYDRODIURIL) 25 MG tablet Take 1 tablet by mouth daily.  ? levothyroxine (SYNTHROID) 200 MCG tablet Take 200 mcg by mouth daily.  ? meloxicam (MOBIC) 15 MG tablet Take 15 mg by mouth daily.  ? progesterone (PROMETRIUM) 100 MG capsule progesterone micronized 100 mg capsule ? TAKE 1 CAPSULE BY MOUTH EVERYDAY AT BEDTIME  ? ?No facility-administered encounter medications on file as of 03/17/2022.  ? ? ?Allergies as of 03/17/2022  ? (No Known Allergies)  ? ? ?Past Medical History:  ?Diagnosis Date  ? Hypertension   ? Thyroid disease   ? ? ?Past Surgical History:  ?Procedure Laterality Date  ? DILATION AND CURETTAGE OF UTERUS    ? after miscarriage  ? KNEE SURGERY Right   ? ? ?Family History  ?Problem Relation Age of Onset  ? Colon cancer Neg Hx   ? Colon polyps Neg Hx   ? Esophageal cancer Neg Hx   ? Rectal cancer Neg Hx   ? Stomach cancer Neg Hx   ? ? ?Social History  ? ?Socioeconomic History  ? Marital status: Single  ?  Spouse name: Not on file  ? Number of  children: Not on file  ? Years of education: Not on file  ? Highest education level: Not on file  ?Occupational History  ? Not on file  ?Tobacco Use  ? Smoking status: Never  ? Smokeless tobacco: Never  ?Vaping Use  ? Vaping Use: Never used  ?Substance and Sexual Activity  ? Alcohol use: Yes  ?  Comment: rarely  ? Drug use: Never  ? Sexual activity: Not on file  ?Other Topics Concern  ? Not on file  ?Social History Narrative  ? Not on file  ? ?Social Determinants of Health  ? ?Financial Resource Strain: Not on file  ?Food Insecurity: Not on file  ?Transportation Needs: Not on file  ?Physical Activity: Not on file  ?Stress: Not on file  ?Social Connections: Not on file  ?Intimate Partner Violence: Not on file  ? ? ?Review of Systems  ?Constitutional:  Positive for fatigue.  ?Respiratory:  Negative for shortness of breath.   ?Psychiatric/Behavioral:  Positive for sleep disturbance.   ? ?Vitals:  ? 03/17/22 1436  ?BP: 114/78  ?Pulse: 92  ?SpO2: 98%  ? ? ? ?Physical Exam ?Constitutional:   ?   Appearance: She is obese.  ?HENT:  ?   Head: Normocephalic.  ?   Mouth/Throat:  ?   Mouth: Mucous membranes are  moist.  ?Cardiovascular:  ?   Rate and Rhythm: Normal rate and regular rhythm.  ?   Heart sounds: No murmur heard. ?  No friction rub.  ?Pulmonary:  ?   Effort: No respiratory distress.  ?   Breath sounds: No stridor. No wheezing or rhonchi.  ?Musculoskeletal:  ?   Cervical back: No rigidity.  ?Neurological:  ?   Mental Status: She is alert.  ?Psychiatric:     ?   Mood and Affect: Mood normal.  ? ? ?  03/17/2022  ?  2:00 PM  ?Results of the Epworth flowsheet  ?Sitting and reading 0  ?Watching TV 2  ?Sitting, inactive in a public place (e.g. a theatre or a meeting) 0  ?As a passenger in a car for an hour without a break 0  ?Lying down to rest in the afternoon when circumstances permit 2  ?Sitting and talking to someone 0  ?Sitting quietly after a lunch without alcohol 0  ?In a car, while stopped for a few minutes in  traffic 0  ?Total score 4  ? ? ? ?Data Reviewed: ?No previous sleep study ? ?Assessment:  ?Moderate probability of significant obstructive sleep apnea with a history of snoring, witnessed apneas, nonrestorative sleep, ? ?Class I obesity ? ?Pathophysiology of sleep disordered breathing reviewed with the patient ?Treatment options for sleep disordered breathing reviewed with the patient ? ?Plan/Recommendations: ?Schedule patient for home sleep study ? ?Weight loss efforts encouraged ? ?Risks of not treating sleep disordered breathing discussed ? ?Tentative follow-up in about 3 to 4 months ? ? ?Sherrilyn Rist MD ?[facility] ?03/17/2022, 2:53 PM ? ?CC: Merrilee Seashore, MD ? ? ?

## 2022-03-17 NOTE — Patient Instructions (Signed)
Moderate probability of significant obstructive sleep apnea ? ?I will schedule you for a home sleep study ? ?Will update you with results as soon as reviewed ? ?Set up of CPAP therapy few weeks after that ? ?Compliance follow-up after that ? ?Follow-up appointment in 3 to 4 months ? ?Living With Sleep Apnea ?Sleep apnea is a condition in which breathing pauses or becomes shallow during sleep. Sleep apnea is most commonly caused by a collapsed or blocked airway. People with sleep apnea usually snore loudly. They may have times when they gasp and stop breathing for 10 seconds or more during sleep. This may happen many times during the night. ?The breaks in breathing also interrupt the deep sleep that you need to feel rested. Even if you do not completely wake up from the gaps in breathing, your sleep may not be restful and you feel tired during the day. You may also have a headache in the morning and low energy during the day, and you may feel anxious or depressed. ?How can sleep apnea affect me? ?Sleep apnea increases your chances of extreme tiredness during the day (daytime fatigue). It can also increase your risk for health conditions, such as: ?Heart attack. ?Stroke. ?Obesity. ?Type 2 diabetes. ?Heart failure. ?Irregular heartbeat. ?High blood pressure. ?If you have daytime fatigue as a result of sleep apnea, you may be more likely to: ?Perform poorly at school or work. ?Fall asleep while driving. ?Have difficulty with attention. ?Develop depression or anxiety. ?Have sexual dysfunction. ?What actions can I take to manage sleep apnea? ?Sleep apnea treatment ? ?If you were given a device to open your airway while you sleep, use it only as told by your health care provider. You may be given: ?An oral appliance. This is a custom-made mouthpiece that shifts your lower jaw forward. ?A continuous positive airway pressure (CPAP) device. This device blows air through a mask when you breathe out (exhale). ?A nasal  expiratory positive airway pressure (EPAP) device. This device has valves that you put into each nostril. ?A bi-level positive airway pressure (BIPAP) device. This device blows air through a mask when you breathe in (inhale) and breathe out (exhale). ?You may need surgery if other treatments do not work for you. ?Sleep habits ?Go to sleep and wake up at the same time every day. This helps set your internal clock (circadian rhythm) for sleeping. ?If you stay up later than usual, such as on weekends, try to get up in the morning within 2 hours of your normal wake time. ?Try to get at least 7-9 hours of sleep each night. ?Stop using a computer, tablet, and mobile phone a few hours before bedtime. ?Do not take long naps during the day. If you nap, limit it to 30 minutes. ?Have a relaxing bedtime routine. Reading or listening to music may relax you and help you sleep. ?Use your bedroom only for sleep. ?Keep your television and computer out of your bedroom. ?Keep your bedroom cool, dark, and quiet. ?Use a supportive mattress and pillows. ?Follow your health care provider's instructions for other changes to sleep habits. ?Nutrition ?Do not eat heavy meals in the evening. ?Do not have caffeine in the later part of the day. The effects of caffeine can last for more than 5 hours. ?Follow your health care provider's or dietitian's instructions for any diet changes. ?Lifestyle ? ?  ? ?Do not drink alcohol before bedtime. Alcohol can cause you to fall asleep at first, but then it can cause  you to wake up in the middle of the night and have trouble getting back to sleep. ?Do not use any products that contain nicotine or tobacco. These products include cigarettes, chewing tobacco, and vaping devices, such as e-cigarettes. If you need help quitting, ask your health care provider. ?Medicines ?Take over-the-counter and prescription medicines only as told by your health care provider. ?Do not use over-the-counter sleep medicine. You  can become dependent on this medicine, and it can make sleep apnea worse. ?Do not use medicines, such as sedatives and narcotics, unless told by your health care provider. ?Activity ?Exercise on most days, but avoid exercising in the evening. Exercising near bedtime can interfere with sleeping. ?If possible, spend time outside every day. Natural light helps regulate your circadian rhythm. ?General information ?Lose weight if you need to, and maintain a healthy weight. ?Keep all follow-up visits. This is important. ?If you are having surgery, make sure to tell your health care provider that you have sleep apnea. You may need to bring your device with you. ?Where to find more information ?Learn more about sleep apnea and daytime fatigue from: ?American Sleep Association: sleepassociation.org ?National Sleep Foundation: sleepfoundation.org ?National Heart, Lung, and Blood Institute: [employer] ?Summary ?Sleep apnea is a condition in which breathing pauses or becomes shallow during sleep. ?Sleep apnea can cause daytime fatigue and other serious health conditions. ?You may need to wear a device while sleeping to help keep your airway open. ?If you are having surgery, make sure to tell your health care provider that you have sleep apnea. You may need to bring your device with you. ?Making changes to sleep habits, diet, lifestyle, and activity can help you manage sleep apnea. ?This information is not intended to replace advice given to you by your health care provider. Make sure you discuss any questions you have with your health care provider. ?Document Revised: 05/29/2021 Document Reviewed: 09/28/2020 ?[employer] Patient Education ? 2023 [employer] ? ?

## 2022-03-18 ENCOUNTER — Encounter: Payer: Self-pay | Admitting: Pulmonary Disease
